# Patient Record
Sex: Female | Born: 1940 | Race: White | Hispanic: No | Marital: Married | Smoking: Former smoker
Health system: Southern US, Community
[De-identification: ages and names within clinical notes are randomized; demographics above are authoritative.]

## PROBLEM LIST (undated history)

## (undated) DIAGNOSIS — I10 Essential (primary) hypertension: Secondary | ICD-10-CM

## (undated) HISTORY — DX: Essential (primary) hypertension: I10

## (undated) HISTORY — PX: HERNIA REPAIR: SHX51

---

## 2004-06-21 ENCOUNTER — Emergency Department: Payer: Self-pay | Admitting: Emergency Medicine

## 2004-07-15 ENCOUNTER — Encounter: Payer: Self-pay | Admitting: Physician Assistant

## 2004-07-18 ENCOUNTER — Encounter: Payer: Self-pay | Admitting: Physician Assistant

## 2004-08-18 ENCOUNTER — Encounter: Payer: Self-pay | Admitting: Physician Assistant

## 2004-09-18 ENCOUNTER — Encounter: Payer: Self-pay | Admitting: Physician Assistant

## 2008-07-10 ENCOUNTER — Emergency Department: Payer: Self-pay | Admitting: Emergency Medicine

## 2011-12-02 ENCOUNTER — Inpatient Hospital Stay: Payer: Self-pay | Admitting: Surgery

## 2011-12-02 LAB — CBC
HGB: 13.3 g/dL (ref 12.0–16.0)
MCH: 28.4 pg (ref 26.0–34.0)
MCHC: 32.4 g/dL (ref 32.0–36.0)
MCV: 88 fL (ref 80–100)
RBC: 4.69 10*6/uL (ref 3.80–5.20)
RDW: 12.6 % (ref 11.5–14.5)

## 2011-12-02 LAB — COMPREHENSIVE METABOLIC PANEL
Alkaline Phosphatase: 69 U/L (ref 50–136)
BUN: 34 mg/dL — ABNORMAL HIGH (ref 7–18)
Bilirubin,Total: 2.1 mg/dL — ABNORMAL HIGH (ref 0.2–1.0)
Chloride: 103 mmol/L (ref 98–107)
Creatinine: 1.16 mg/dL (ref 0.60–1.30)
EGFR (Non-African Amer.): 48 — ABNORMAL LOW
Osmolality: 284 (ref 275–301)
Potassium: 3.6 mmol/L (ref 3.5–5.1)

## 2011-12-02 LAB — URINALYSIS, COMPLETE
Bilirubin,UR: NEGATIVE
Glucose,UR: NEGATIVE mg/dL (ref 0–75)
Ph: 5 (ref 4.5–8.0)
Specific Gravity: 1.016 (ref 1.003–1.030)
Transitional Epi: 18

## 2011-12-03 LAB — CBC WITH DIFFERENTIAL/PLATELET
Basophil #: 0 10*3/uL (ref 0.0–0.1)
Basophil %: 0.2 %
Eosinophil #: 0 10*3/uL (ref 0.0–0.7)
Eosinophil %: 0.1 %
HCT: 36.1 % (ref 35.0–47.0)
HGB: 11.7 g/dL — ABNORMAL LOW (ref 12.0–16.0)
Lymphocyte #: 1 10*3/uL (ref 1.0–3.6)
Lymphocyte %: 6.8 %
MCHC: 32.3 g/dL (ref 32.0–36.0)
MCV: 88 fL (ref 80–100)
Monocyte #: 1 x10 3/mm — ABNORMAL HIGH (ref 0.2–0.9)
Monocyte %: 7.1 %
Neutrophil #: 12.3 10*3/uL — ABNORMAL HIGH (ref 1.4–6.5)
Neutrophil %: 85.8 %
RBC: 4.09 10*6/uL (ref 3.80–5.20)
WBC: 14.3 10*3/uL — ABNORMAL HIGH (ref 3.6–11.0)

## 2011-12-03 LAB — BASIC METABOLIC PANEL
Calcium, Total: 8.2 mg/dL — ABNORMAL LOW (ref 8.5–10.1)
Chloride: 101 mmol/L (ref 98–107)
Co2: 25 mmol/L (ref 21–32)
Creatinine: 1.33 mg/dL — ABNORMAL HIGH (ref 0.60–1.30)
EGFR (Non-African Amer.): 41 — ABNORMAL LOW
Glucose: 141 mg/dL — ABNORMAL HIGH (ref 65–99)

## 2011-12-04 LAB — CBC WITH DIFFERENTIAL/PLATELET
Basophil #: 0 10*3/uL (ref 0.0–0.1)
Basophil %: 0.1 %
Eosinophil #: 0.1 10*3/uL (ref 0.0–0.7)
HCT: 32.8 % — ABNORMAL LOW (ref 35.0–47.0)
Lymphocyte #: 1.7 10*3/uL (ref 1.0–3.6)
Lymphocyte %: 14.6 %
MCH: 28.4 pg (ref 26.0–34.0)
MCHC: 31.9 g/dL — ABNORMAL LOW (ref 32.0–36.0)
Monocyte #: 0.9 x10 3/mm (ref 0.2–0.9)
Monocyte %: 7.7 %
Neutrophil #: 8.9 10*3/uL — ABNORMAL HIGH (ref 1.4–6.5)
RDW: 12.7 % (ref 11.5–14.5)
WBC: 11.5 10*3/uL — ABNORMAL HIGH (ref 3.6–11.0)

## 2011-12-04 LAB — BASIC METABOLIC PANEL
Anion Gap: 8 (ref 7–16)
BUN: 26 mg/dL — ABNORMAL HIGH (ref 7–18)
Chloride: 108 mmol/L — ABNORMAL HIGH (ref 98–107)
Co2: 25 mmol/L (ref 21–32)
Creatinine: 0.89 mg/dL (ref 0.60–1.30)
EGFR (Non-African Amer.): >60
Glucose: 123 mg/dL — ABNORMAL HIGH (ref 65–99)
Osmolality: 287 (ref 275–301)
Potassium: 4.1 mmol/L (ref 3.5–5.1)

## 2011-12-08 LAB — PATHOLOGY REPORT

## 2012-01-14 ENCOUNTER — Ambulatory Visit: Payer: Self-pay | Admitting: Urology

## 2012-01-23 ENCOUNTER — Ambulatory Visit: Payer: Self-pay | Admitting: Urology

## 2012-02-26 ENCOUNTER — Ambulatory Visit: Payer: Self-pay | Admitting: Urology

## 2012-07-01 IMAGING — CT CT ABD-PELV W/ CM
1 of 3 series · 13 of 32 positions shown, 18 images · non-contrast
Comparison: none

REASON FOR EXAM: (1) abd pain - increased distension - eval for verntral
hernia; (2) abd pain - i
COMMENTS:

[Series 2: soft tissue · axial · 0.91mm/px · z∈[-824,-412]mm · 13 of 157 slices shown, 18 images]
[im 10/157  soft-tissue]
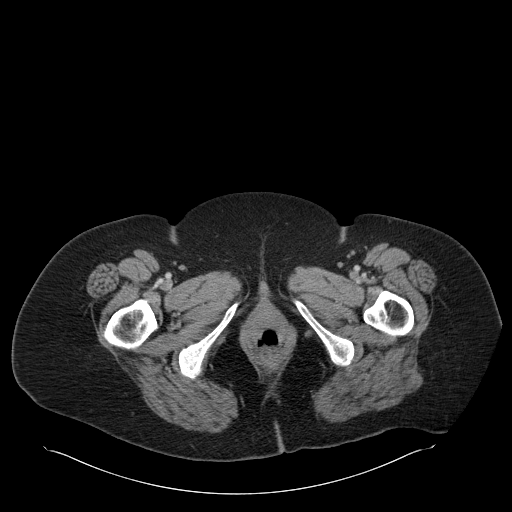
[im 10/157  bone]
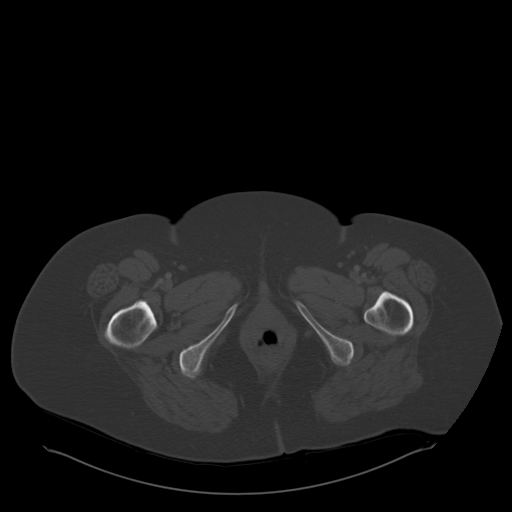
[im 28/157  soft-tissue]
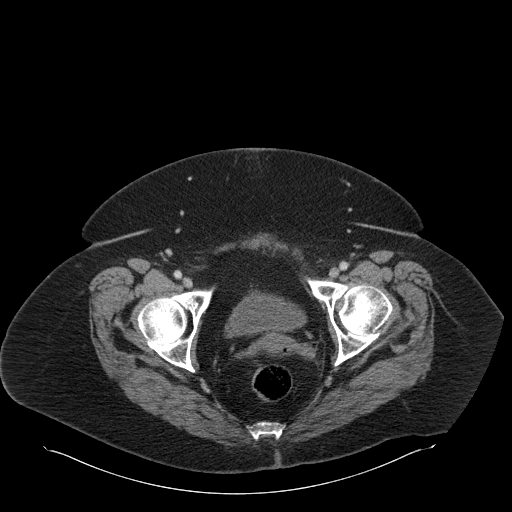
[im 37/157  soft-tissue]
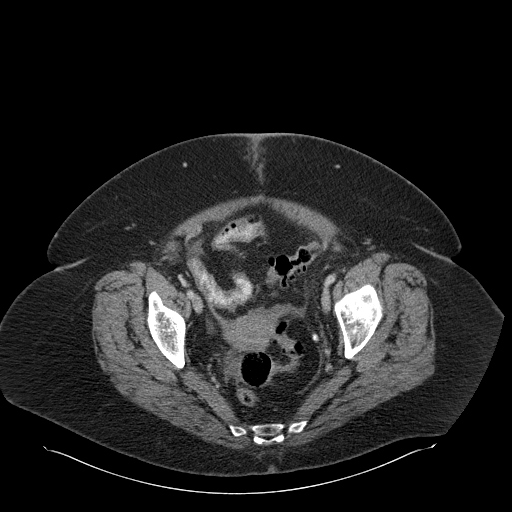
[im 46/157  soft-tissue]
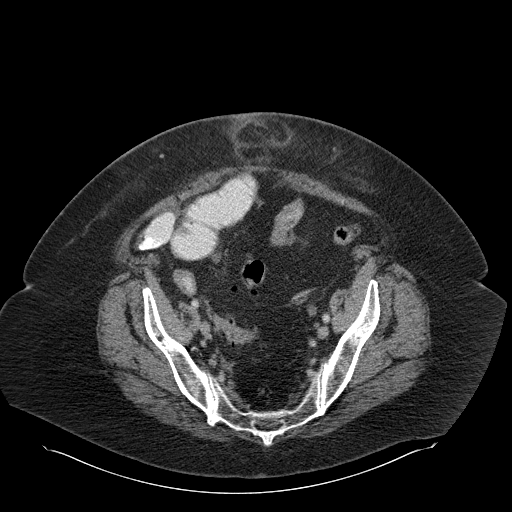
[im 65/157  soft-tissue]
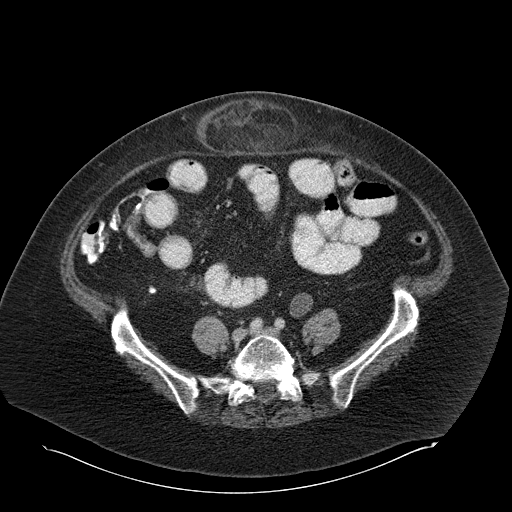
[im 74/157  soft-tissue]
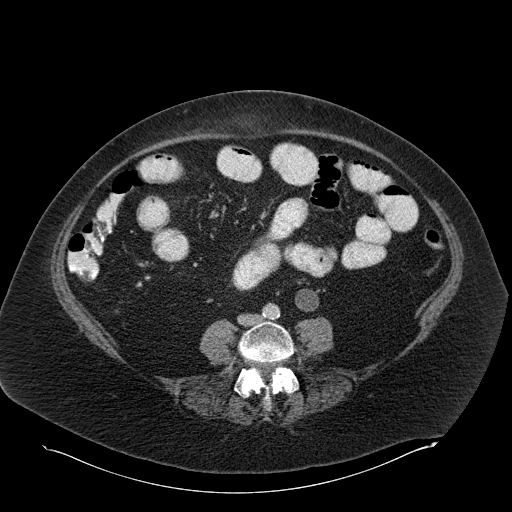
[im 83/157  soft-tissue]
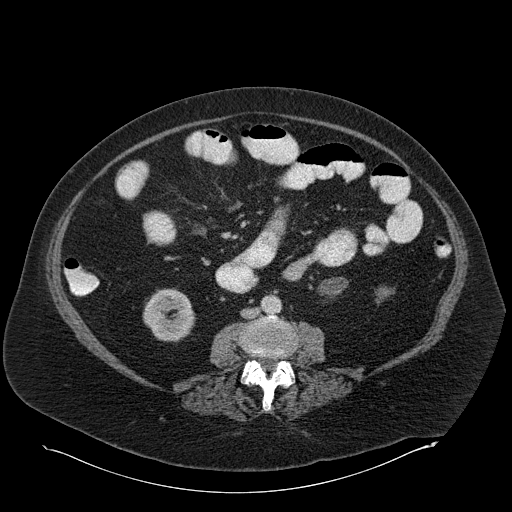
[im 101/157  soft-tissue]
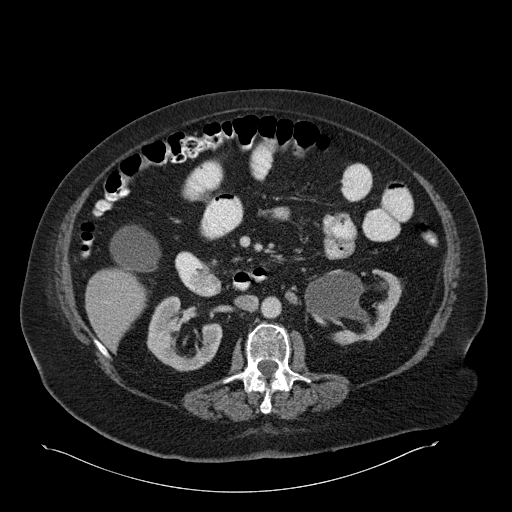
[im 111/157  soft-tissue]
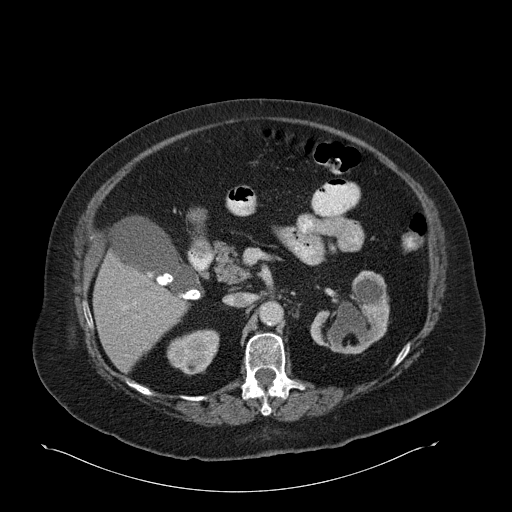
[im 111/157  bone]
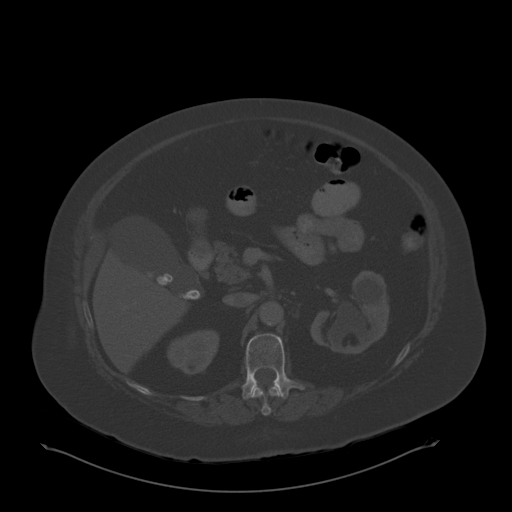
[im 120/157  soft-tissue]
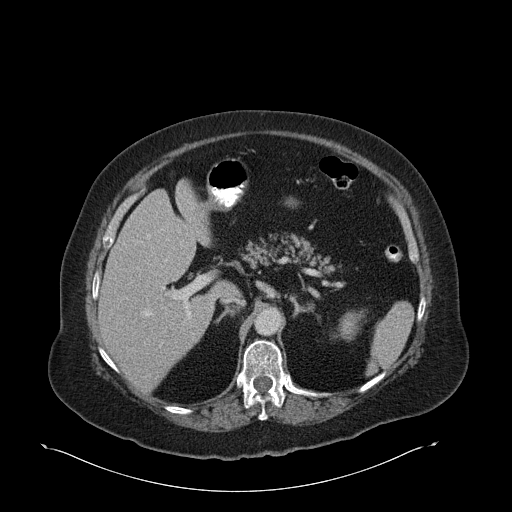
[im 120/157  lung]
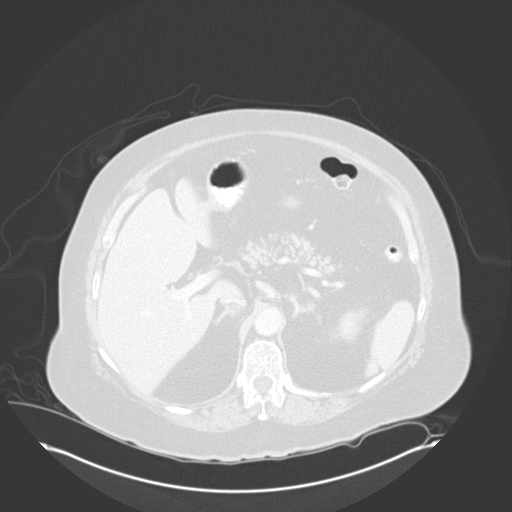
[im 129/157  lung]
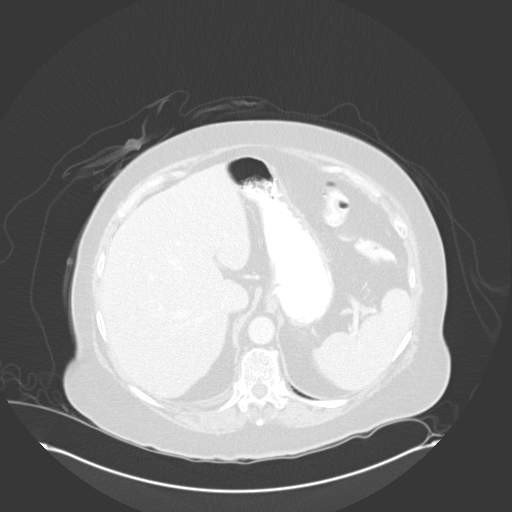
[im 138/157  soft-tissue]
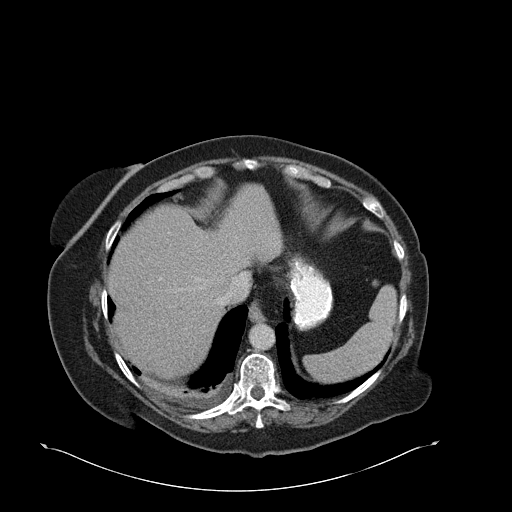
[im 138/157  lung]
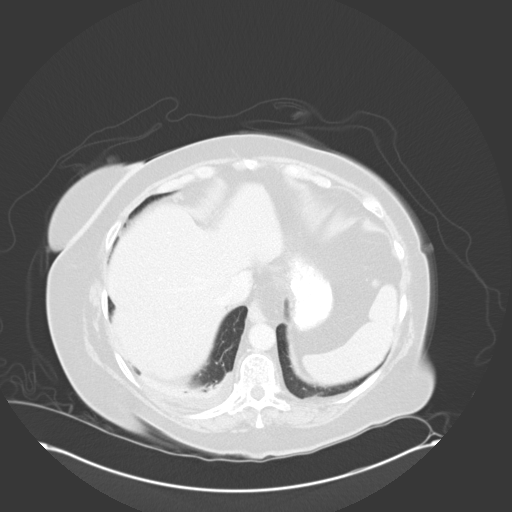
[im 147/157  soft-tissue]
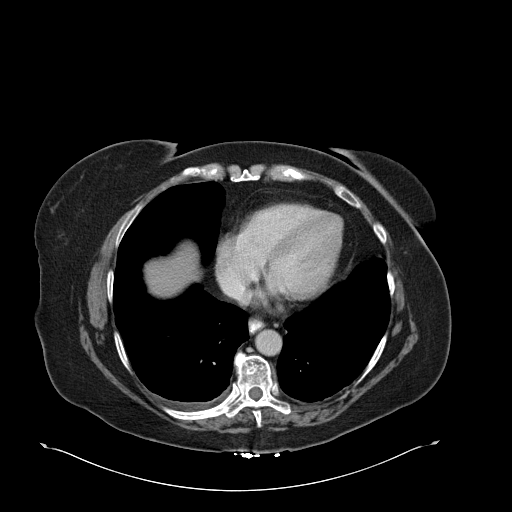
[im 147/157  lung]
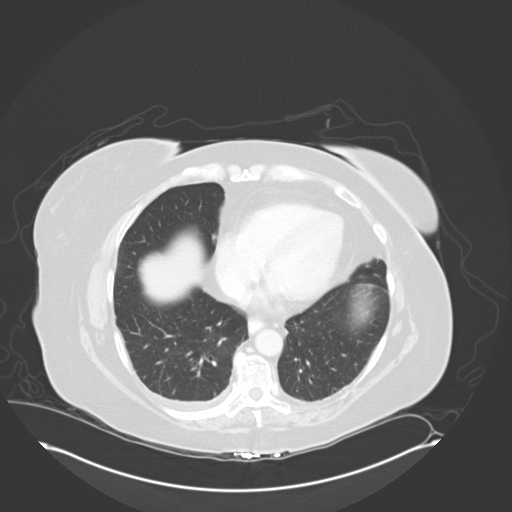

[13 of 32 positions shown; findings below may reference images not displayed]

PROCEDURE:     CT  - CT ABDOMEN / PELVIS  W  - December 02, 2011  [DATE]

RESULT:     CT of the abdomen and pelvis with 80 mL of 3sovue-4JJ iodinated
intravenous contrast and oral contrast is performed. There is no previous
exam for comparison. Images are reconstructed at 3.0 mm slice thickness in
the axial plane.

There is moderate to severe left hydronephrosis and pronounced left
hydroureter extending into the pelvis where there are 2 stones in the 10 to
11 mm size range in the distal ureter proximal to the left ureterovesical
junction. The ureter distal to this is decompressed. These stones are
visualized between images 115 and 121. The urinary bladder is decompressed.
The uterus is unremarkable. No nephrolithiasis is evident. There is a
supraumbilical ventral hernia containing a loop of small bowel with
thickened wall and surrounding presumed inflammatory stranding. There is no
pneumatosis or perforation but correlation for incarceration would be
recommended. Surgical consultation is suggested. Cholelithiasis is
demonstrated. The appendix appears normal. There is moderate small bowel
distention proximal to the hernia. The limb of the small bowel distal to
this is nondistended. This suggests partial attraction from the herniated
loop of small bowel. No significant ascites is present. There is a small
amount of free fluid in the pelvis. Some scattered colonic diverticulosis is
present. The liver shows evidence of fatty infiltration. The spleen is
unremarkable. The adrenal glands are normal. The aorta is normal.
IMPRESSION: 1. Partial small bowel obstruction secondary to a ventral hernia in the
umbilical region just superior to the level of the umbilicus. The loop of
small bowel shows a thickened wall with some proximal obstructive changes.
Inflammatory stranding is seen in the adjacent fat. No perforation or
abscess is evident.
2. Obstructing distal left ureteral calculi better large measuring up to 10
to 11 mm in diameter. Moderate to severe left proximal hydroureter and
hydronephrosis is evident. Urologic consultation is recommended with
consideration for stone extraction or lithotripsy.
3. Cholelithiasis. No definite evidence of acute cholecystitis.
Findings were discussed with the requesting physician at the time of
dictation.(*)

## 2012-08-22 IMAGING — CR DG ABDOMEN 1V
1 series · 2 of 2 positions shown · non-contrast
Comparison: none

REASON FOR EXAM: Nephrolithiasis and Renal Colic
COMMENTS:

PROCEDURE:     DXR - DXR KIDNEY URETER BLADDER  - January 23, 2012 [DATE]
RESULT:     Comparisons:  None

[Series 1: ap · 0.17mm/px · 2 of 2 slices shown]
[im 1/2]
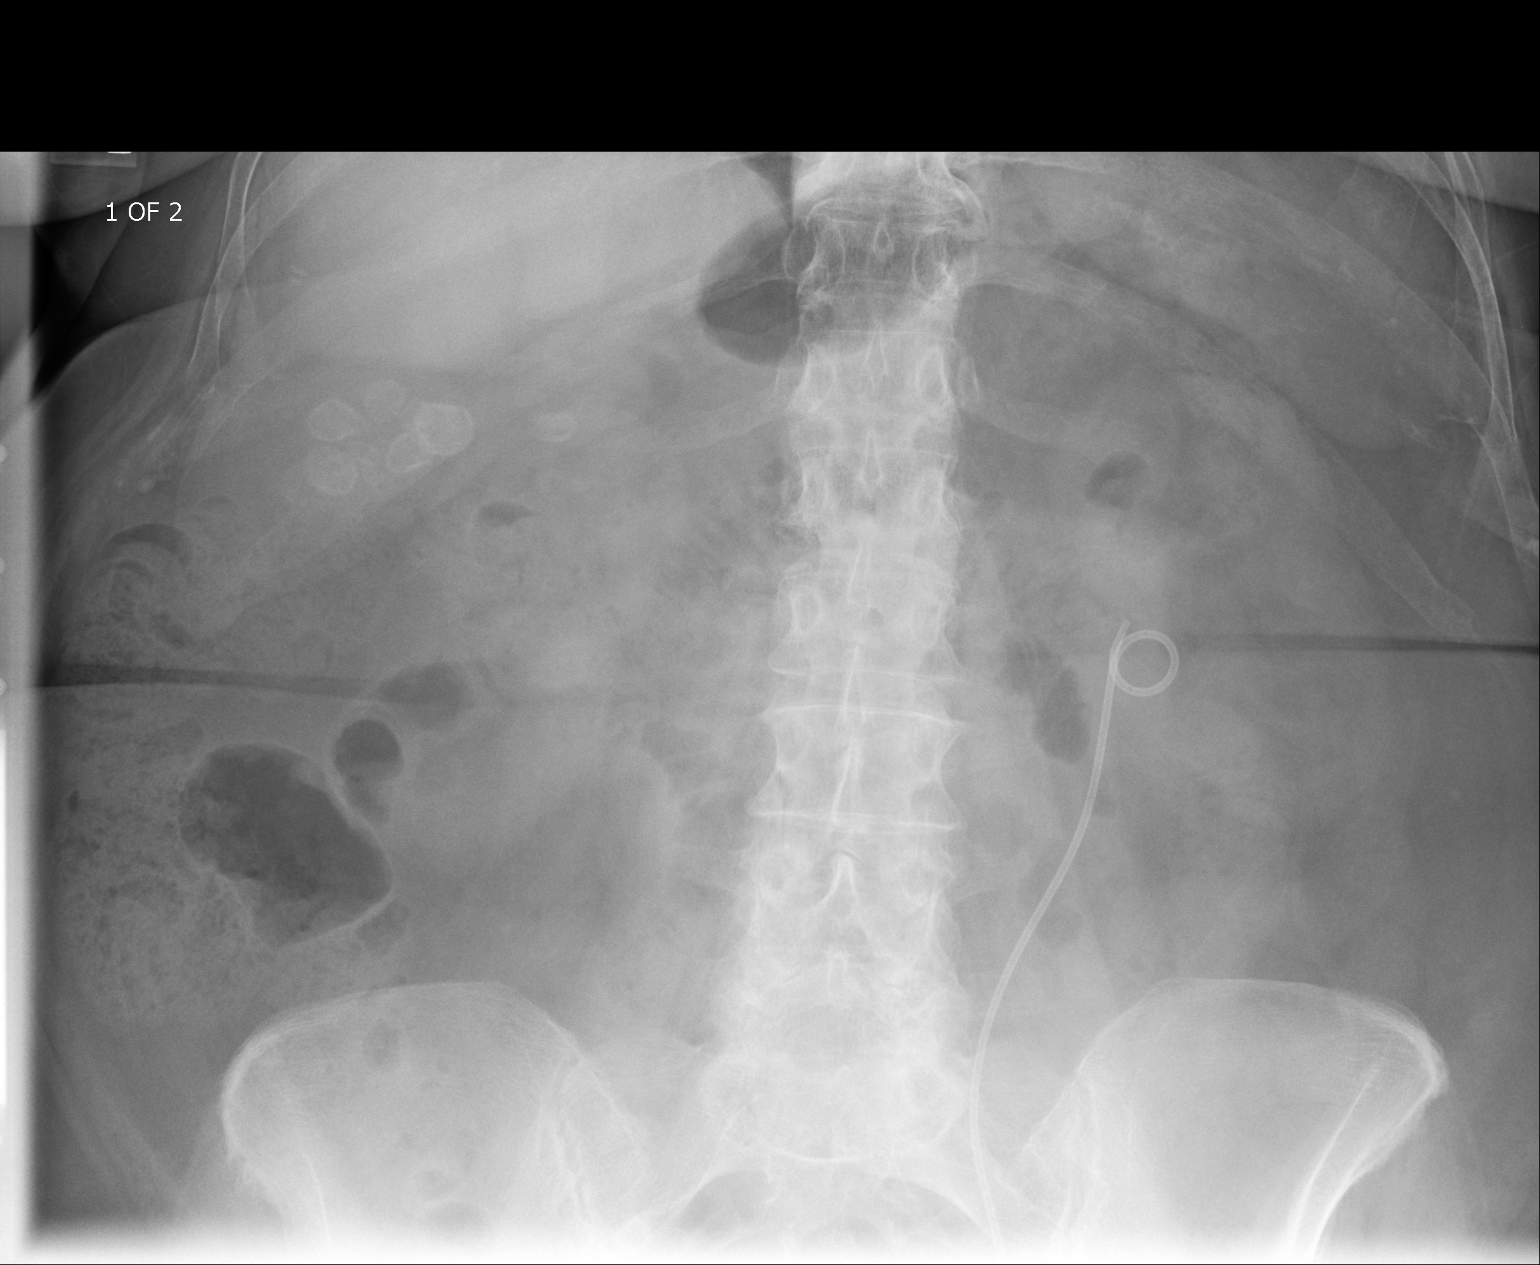
[im 2/2]
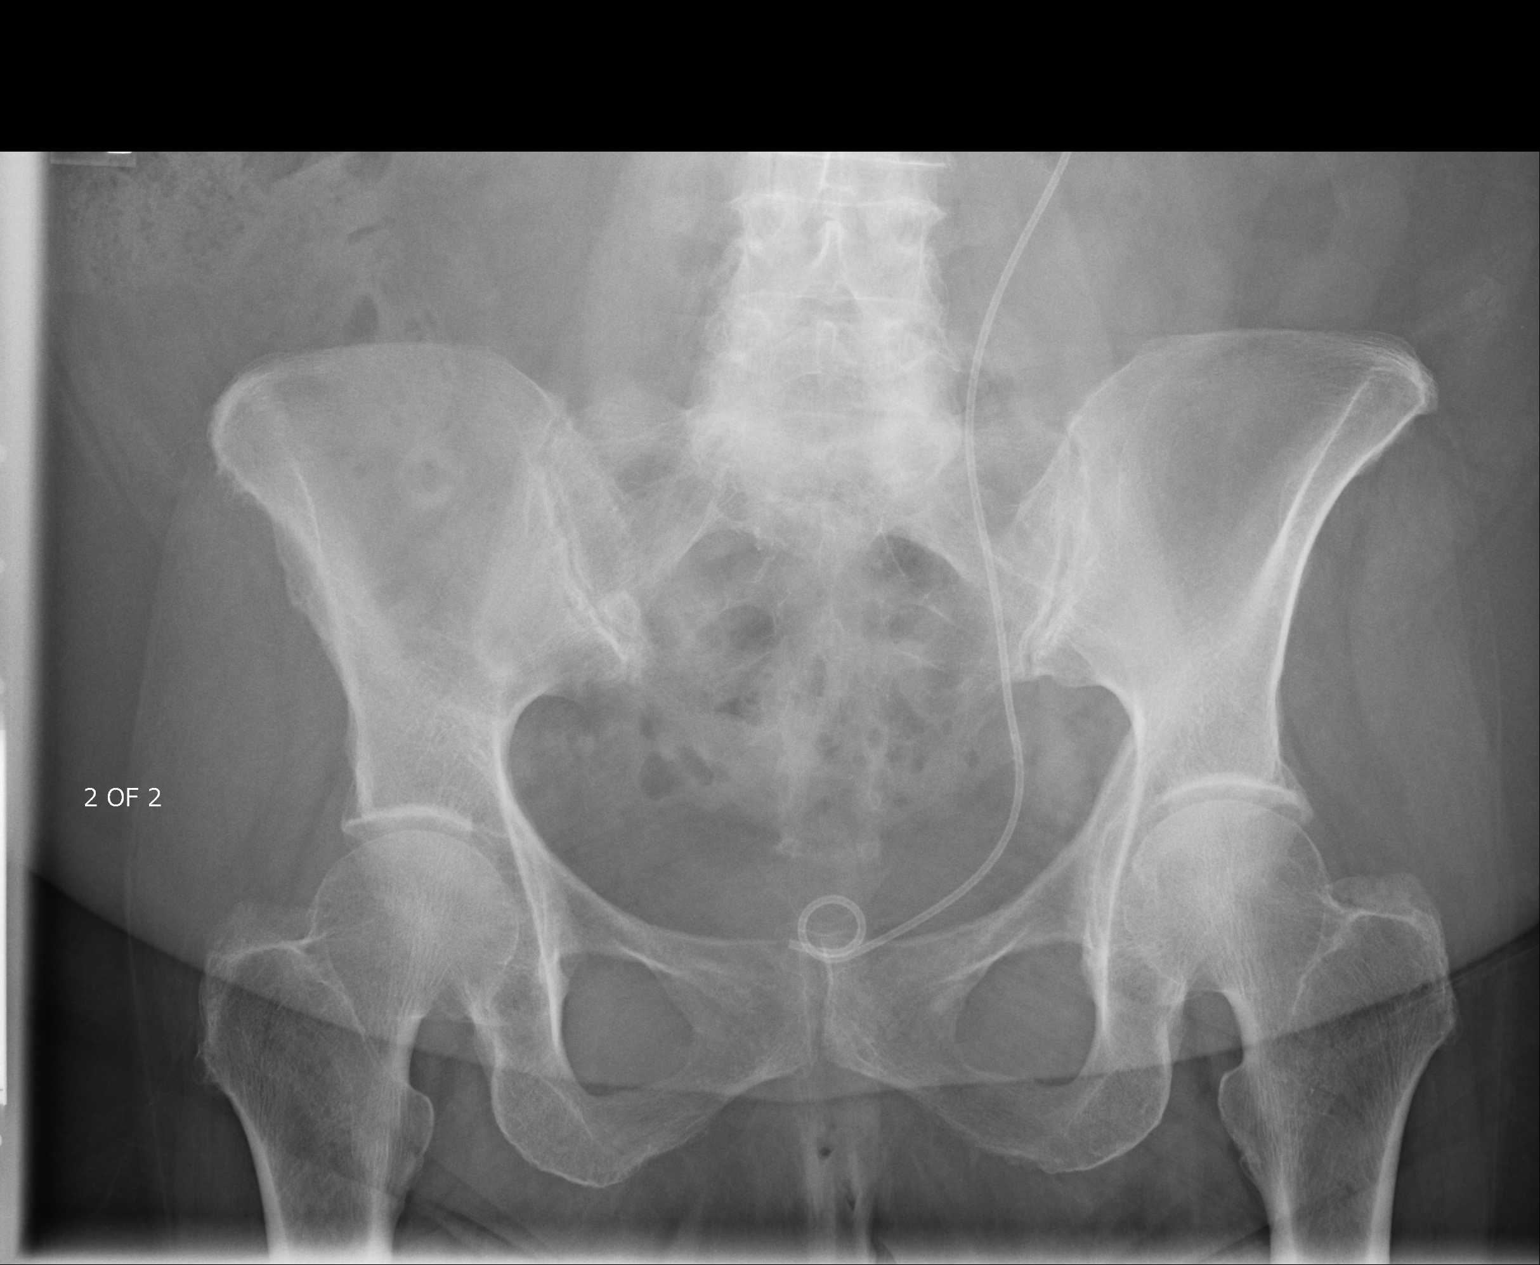

[2 of 2 positions shown; findings below may reference images not displayed]

FINDINGS: Supine radiograph of the abdomen is provided.

There is a nonspecific bowel gas pattern. There is no bowel dilatation to
suggest obstruction. There are cholelithiasis. There is a left
nephroureteral stent present. There is no pathologic calcification along the
expected course of the ureters. There is no evidence of pneumoperitoneum,
portal venous gas, or pneumatosis.

The osseous structures are unremarkable.
IMPRESSION: Left nephroureteral stent in satisfactory position.

[REDACTED]

## 2014-01-10 DIAGNOSIS — J309 Allergic rhinitis, unspecified: Secondary | ICD-10-CM | POA: Insufficient documentation

## 2014-01-10 DIAGNOSIS — I10 Essential (primary) hypertension: Secondary | ICD-10-CM | POA: Insufficient documentation

## 2014-01-10 DIAGNOSIS — F419 Anxiety disorder, unspecified: Secondary | ICD-10-CM | POA: Insufficient documentation

## 2014-05-26 ENCOUNTER — Ambulatory Visit (INDEPENDENT_AMBULATORY_CARE_PROVIDER_SITE_OTHER): Payer: 59

## 2014-05-26 ENCOUNTER — Ambulatory Visit (INDEPENDENT_AMBULATORY_CARE_PROVIDER_SITE_OTHER): Payer: 59 | Admitting: Podiatry

## 2014-05-26 ENCOUNTER — Encounter: Payer: Self-pay | Admitting: Podiatry

## 2014-05-26 VITALS — BP 122/63 | HR 60 | Resp 16 | Ht 64.0 in | Wt 212.0 lb

## 2014-05-26 DIAGNOSIS — M722 Plantar fascial fibromatosis: Secondary | ICD-10-CM

## 2014-05-26 DIAGNOSIS — M7661 Achilles tendinitis, right leg: Secondary | ICD-10-CM

## 2014-05-26 MED ORDER — TRIAMCINOLONE ACETONIDE 10 MG/ML IJ SUSP
10.0000 mg | Freq: Once | INTRAMUSCULAR | Status: AC
  Administered 2014-05-26: 10 mg

## 2014-05-26 NOTE — Progress Notes (Signed)
   Subjective:    Patient ID: Courtney Mccoy, female    DOB: 1941-06-10, 73 y.o.   MRN: [id number]  HPI Comments: The right heel has been aching. It has been going on for over 1 year. It is the back of the heel and around the sides of the heel. Been using etodolac with no relief  Foot Pain      Review of Systems  Musculoskeletal: Positive for back pain.  All other systems reviewed and are negative.      Objective:   Physical Exam        Assessment & Plan:

## 2014-05-26 NOTE — Progress Notes (Signed)
Subjective:     Patient ID: Courtney Mccoy, female   DOB: 1941/07/23, 73 y.o.   MRN: [id number]  Foot Pain   patient states stating my right heel has really been bothering me for about a year and that been trying oral anti-inflammatories and stretching without relief   Review of Systems  All other systems reviewed and are negative.      Objective:   Physical Exam  Nursing note and vitals reviewed. Constitutional: She is oriented to person, place, and time.  Cardiovascular: Intact distal pulses.   Musculoskeletal: Normal range of motion.  Neurological: She is oriented to person, place, and time.  Skin: Skin is warm.   neurovascular status intact with muscle strength adequate and range of motion of the subtalar and midtarsal joint within normal limits. Patient is noted to have good digital perfusion and a well-developed arch upon weightbearing and I noted significant discomfort in the posterior right heel at the Achilles insertion on the medial side. The Achilles tendon itself is functioning well     Assessment:     Acute Achilles tendinitis right with inflammation and pain medial side    Plan:     H&P and condition discussed along with x-rays. Discussed injection and immobilization treatment explaining the chances for rupture associated with injections. Patient is willing to accept this risk and today I injected with 3 mg of dexamethasone Kenalog and 5 mg Xylocaine and instructed on reduced activity only put it on the medial side not the central or lateral side of the tendon and then applied air fracture walker. Gave instructions on continuing to wear the boot for the next several weeks and reappoint in 4 weeks

## 2014-05-26 NOTE — Patient Instructions (Signed)

## 2014-06-23 ENCOUNTER — Encounter: Payer: Self-pay | Admitting: Podiatry

## 2014-06-23 ENCOUNTER — Ambulatory Visit (INDEPENDENT_AMBULATORY_CARE_PROVIDER_SITE_OTHER): Payer: 59 | Admitting: Podiatry

## 2014-06-23 DIAGNOSIS — M7661 Achilles tendinitis, right leg: Secondary | ICD-10-CM

## 2014-06-25 NOTE — Progress Notes (Signed)
Subjective:     Patient ID: Courtney BinetMarsha Mccoy, female   DOB: 1941/07/25, 73 y.o.   MRN: [id number]  HPIpatient states has mild discomfort   Review of Systems     Objective:   Physical Exam Patient states my foot has improved quite a bit and the pain is getting better with immobilization and the injection    Assessment:     Improving Achilles tendinitis right with inflammation still present but to a much lighter degree upon palpation    Plan:     Reviewed condition and advised on physical therapy ice therapy and occasional boot usage. Patient is discharged and less symptoms were to intensify or return

## 2014-12-10 NOTE — Consult Note (Signed)
PATIENT NAME:  Courtney Mccoy, Courtney Mccoy MR#:  [id number] DATE OF BIRTH:  13-Dec-1940  DATE OF CONSULTATION:  12/02/2011  REFERRING PHYSICIAN:  Janalyn Harderavid Kaminski, MD CONSULTING PHYSICIAN:  Scott C. Stoioff, MD  REASON FOR CONSULTATION: Left hydronephrosis and ureteral stones.   HISTORY OF PRESENT ILLNESS: This is a 74 year old female who was seen by her primary care physician earlier today for moderate to severe mid abdominal pain. She was subsequently sent to the emergency department for evaluation of a possible incarcerated ventral hernia. CT scan was performed which did show a ventral hernia containing a loop of small bowel. She was incidentally found to have moderate to severe left hydronephrosis and hydroureter with two large distal ureteral stones measuring approximately 10 mm. She does have a prior history of stone disease and underwent an open ureteral lithotomy by Dr. Leonette MonarchHarman greater than 20 years ago. She has had no recent problems with stones and denies flank pain, frequency, urgency, or dysuria. She has had no fever or chills.   PAST MEDICAL HISTORY: Hypertension.   PAST SURGICAL HISTORY: Ureterolithotomy.  MEDICATIONS: Lisinopril 20 mg daily.   ALLERGIES: No known drug allergies.   SOCIAL HISTORY: Prior history of tobacco use, none recently.   REVIEW OF SYSTEMS: GENERAL: Denies fever/chills. GASTROINTESTINAL: Positive abdominal pain. GENITOURINARY: as per the history of present illness. CARDIOVASCULAR: Positive hypertension. PULMONARY: Denies cough or shortness of breath.  HEME: No history of bleeding or clotting disorders. The remainder of the review of systems is otherwise noncontributory.   PHYSICAL EXAMINATION:   VITAL SIGNS: Temperature 97.9, pulse 124, and blood pressure 118/67.   GENERAL: The patient was in moderate distress secondary to mid abdominal pain.   ABDOMEN: Protuberant with moderate supraumbilical tenderness.  BACK: No CVA tenderness.   DATA: Urinalysis: Significant  pyuria. WBCs 17.8. Creatinine 1.16.  CT of the abdomen and pelvis was reviewed. There is moderate to severe left hydronephrosis and hydroureter to the distal ureter where two 10 mm stones are seen. There is some loss of renal parenchyma on the left and these findings are most likely chronic.   IMPRESSION: Left ureteral calculi with moderate to severe hydronephrosis - she has no flank pain or fever. Based on her CT scan this does not appear to be acute, however, it does need to be addressed. Her abdominal pain appears more related to her ventral hernia and not the stone. She is afebrile and I feel the stones can be treated as an outpatient.   RECOMMENDATIONS:  1. If she does require surgery for her ventral hernia, I would recommend placement of a left ureteral stent under the same anesthetic. If inpatient management is not deemed necessary, we will schedule stent placement/possible ureteroscopy as an outpatient.  2. We will follow.  ____________________________ Verna CzechScott C. Lonna CobbStoioff, MD scs:slb D: 12/02/2011 18:05:50 ET T: 12/03/2011 09:36:21 ET JOB#: [id number]  cc: Lorin PicketScott C. Lonna CobbStoioff, MD, <Dictator> Riki AltesSCOTT C STOIOFF MD ELECTRONICALLY SIGNED 12/11/2011 18:48

## 2014-12-10 NOTE — Consult Note (Signed)
She is to have her incarcerated ventral hernia repair this morning.  I spoke with Dr. Michela PitcherEly yesterday evening and he may need to use mesh in the repair.  She does have significant pyuria and there is an increased incidence of bacteremia with stent placement.  Since she is asymptomatic and the ureteral calculi do not appear to be acute will not plan on placing stent at the time of her repair.  Electronic Signatures: Riki AltesStoioff, Jasime Westergren C (MD)  (Signed on 17-Apr-13 08:07)  Authored  Last Updated: 17-Apr-13 08:07 by Riki AltesStoioff, Chessica Audia C (MD)

## 2014-12-10 NOTE — Consult Note (Signed)
urine cx growing > 100,000 gnr.antibioticschedule outpt f/u for stone management  Electronic Signatures: Riki AltesStoioff, Scott C (MD)  (Signed on 17-Apr-13 15:47)  Authored  Last Updated: 17-Apr-13 15:47 by Riki AltesStoioff, Scott C (MD)

## 2014-12-10 NOTE — Discharge Summary (Signed)
PATIENT NAME:  Courtney Mccoy, Courtney Mccoy MR#:  [id number] DATE OF BIRTH:  08/30/1940  DATE OF ADMISSION:  12/02/2011 DATE OF DISCHARGE:  12/06/2011  BRIEF HISTORY: Courtney Mccoy is a 74 year old woman seen in the Emergency Room with an incarcerated ventral hernia. She has had a longstanding ventral hernia just above her umbilicus, but she noted the sudden onset of pain approximately 48 to 72 hours prior to admission to the hospital. She did not vomit, but has had crampy abdominal pain with anorexia. She presented to the Emergency Room for further evaluation after seeing her primary care doctor. Work-up in the Emergency Room revealed an elevated white blood cell count of 17,000 with presence of an incarcerated hernia. There was surrounding inflammatory changes in the omentum in the hernia sac. The bowel did not appear obstructed as there was proximal and distal contrast. There was also contrast all the way to the colon. There was no evidence of any pneumatosis. She was admitted to the hospital, placed on IV antibiotics and pain medication. She was taken to surgery on the following day where she underwent an incarcerated abdominal hernia repair. The procedure was uncomplicated. The procedure was repaired with Composix mesh. She did well postoperatively with return of bowel function very quickly. She was able to tolerate a regular diet by 04/19 and discharged home on 04/20.   DISCHARGE MEDICATIONS:  1. Lisinopril 20 mg p.o. daily.  2. Norco 1 to 2 mg p.o. q.6 hours p.r.n.   FINAL DISCHARGE DIAGNOSES:  1. Hypertension. 2. Incarcerated ventral hernia.  SURGERY: Incarcerated ventral hernia repair.   ____________________________ Carmie Endalph L. Ely III, MD rle:ap D: 12/12/2011 22:47:51 ET          T: 12/13/2011 13:19:45 ET              JOB#: [id number] cc: Carmie Endalph L. Ely III, MD, <Dictator> Marina Goodellale E. Feldpausch, MD Quentin OreALPH L ELY MD ELECTRONICALLY SIGNED 12/15/2011 21:46

## 2014-12-10 NOTE — Op Note (Signed)
PATIENT NAMOren Binet:  Courtney Mccoy, Courtney Mccoy MR#:  [id number] DATE OF BIRTH:  05/12/1941  DATE OF PROCEDURE:  12/03/2011  PREOPERATIVE DIAGNOSIS: Incarcerated ventral hernia.   POSTOPERATIVE DIAGNOSIS: Incarcerated ventral hernia.    OPERATION: Repair of incarcerated ventral hernia.   ANESTHESIA: General.  SURGEON: Quentin Orealph L. Ely, III, MD    OPERATIVE PROCEDURE: With the patient in the supine position after induction of appropriate general anesthesia, the patient's abdomen was prepped with Betadine and draped with sterile towels. An alcohol wipe and Betadine impregnated Steri-Drape were utilized. A midline incision was made from just above the umbilicus to just below the umbilicus and carried down through the subcutaneous tissue with Bovie electrocautery. The hernia sac was immediately encountered and dissected back to its base on the fascia wall. Sac could not be reduced. It was opened and there was a large amount of ischemic appearing omentum in the sac which was adherent to the sac wall. The omentum was dissected free and removed. There was a single loop of small bowel which was mildly edematous but did not have any evidence of ischemic change and did not demonstrate any necrosis. It was dropped back into the abdomen. The defect had to be slightly enlarged to accommodate reduction of all the material. Final defect was approximately 4 cm. An 8 cm piece of composite mesh was brought to the table and inserted into the peritoneal cavity. The Gore-Tex side was placed down. The mesh was sutured in place using 0 Surgilon. The tags were cut. The fascia was then closed over the mesh using interrupted sutures of 0 Maxon. A 10 mm flat Jackson-Pratt drain was inserted through a separate stab wound into the dead space. The umbilical skin was reapproximated with 0 Maxon. Skin was clipped. Sterile dressings applied. The patient was returned to the recovery room having tolerated the procedure well. Sponge, instrument, and needle  counts were correct x2 in the operating room.   ____________________________ Carmie Endalph L. Ely III, MD rle:drc D: 12/03/2011 08:43:01 ET T: 12/03/2011 10:42:58 ET JOB#: [id number] cc: Quentin Orealph L. Ely III, MD, <Dictator>, Scott C. Lonna CobbStoioff, MD, Marina Goodellale E. Feldpausch, MD Quentin OreALPH L ELY MD ELECTRONICALLY SIGNED 12/03/2011 17:27

## 2014-12-10 NOTE — H&P (Signed)
PATIENT NAMOren Mccoy:  Mccoy, Courtney MR#:  [id number] DATE OF BIRTH:  July 01, 1941  DATE OF ADMISSION:  12/02/2011  PRIMARY CARE PHYSICIAN: Nonlocal physician ADMITTING PHYSICIAN: Dr. Michela PitcherEly  CHIEF COMPLAINT: Abdominal pain.   BRIEF HISTORY: Courtney BinetMarsha Sweeney is a 74 year old woman seen in the Emergency Room with a several day history of abdominal pain. She noted the gradual development of an umbilical hernia over the last couple of months but with lifting a heavy load over the weekend and noticed a sudden onset of nausea and dry heaves. She did not vomit. The pain was crampy in her umbilicus and persisted over the next 72 hours. She has been unable to eat normally and she does not feel hungry but she has not vomited and has not had any further dry heaves. She denies any fever. She has been passing gas and has had a couple of bowel movements. She went to see her primary care doctor today who thought she probably had an incarcerated hernia and referred her to the Emergency Room for further evaluation.   Work-up in the Emergency Room revealed a slightly elevated white blood cell count of 17,000. Contrasted CT scan did confirm the presence of what appears to be an incarcerated hernia with surrounding inflammatory omentum changes in the hernia sac. The bowel was not obstructed though as there is contrast proximally and distally and there is contrast in the colon all the way to the distal transverse colon. There did not appear to be any evidence of pneumatosis and some mild bowel wall thickening. She does also have severely dilated left ureter with an obstructing stone and obvious hydronephrosis. Urinalysis demonstrated a large amount of red and white cells in the urine. The urology service has been consulted by the Emergency Room physicians.   PAST MEDICAL HISTORY: Her only previous surgery has been for nephrolithiasis. She denies history of hepatitis, yellow jaundice, pancreatitis, peptic ulcer disease, gallbladder disease,  or diverticulitis. She has no cardiac disease or diabetes. She is hypertensive currently on single dose therapy.   SOCIAL HISTORY: She is not a cigarette smoker and does not drink alcohol. She is retired and does not work outside her home.   REVIEW OF SYSTEMS: 10 point review of systems undertaken with the patient. She denies any general change in physical condition, particularly any weakness or malaise. She denies any visual changes or hearing loss. She denies any neck or cervical pain. She has no swallowing complaints. She has no shortness of breath or dyspnea on exertion. She denies any cardiac dysrhythmias, palpitations or orthopnea. GI symptoms are noted above. GU symptoms are noted above. She has no psychiatric or neurologic symptoms and she denies any extremity problems. She has no gait disturbances and no numbness or tingling in her lower extremities.   PHYSICAL EXAMINATION:  VITAL SIGNS: Blood pressure 100/70, heart rate is 96 and regular. She is afebrile.   GENERAL: She is alert and oriented.   HEENT: Exam is unremarkable. She has no scleral icterus. Her pupils were equally round. She has no facial deformity.   NECK: Supple without adenopathy. Trachea is midline and I cannot palpate her thyroid gland.   PULMONARY: Reveals no adventitious sounds and she appears to have normal excursion.   CARDIAC: No murmurs or gallops to my ear. Seems to be in normal sinus rhythm.   ABDOMEN: Distended with obvious erythematous and cellulitic changes around the umbilicus. She has a clearly defined mass in the supraumbilical area. It is tender and nonreducible. She has active  bowel sounds without evidence of any rushes. She has no rebound but does have some guarding in the hernia sac. There are no inguinal hernias noted.   EXTREMITIES: She has no lower extremity deformity, full range of motion, and good distal pulses.   PSYCHIATRIC: Normal orientation and affect.   LABORATORY, DIAGNOSTIC AND  RADIOLOGICAL DATA: I have independently reviewed the CT scan and have reviewed it with the radiologist. There clearly is bowel and preperitoneal fat or omentum in the hernia sac. The bowel enters and exits in a single slice and does not appear to be any evidence of complete obstruction. Contrast goes into the hernia and does come out although is a slight bowel mismatch. Does not appear to be any pneumatosis or bowel wall thickening to suggest ischemic change. She does have a very distended left ureter and an obviously obstructing stone and multiple gallstones noted. White blood cell count 17,000. Bilirubin is slightly elevated at 2.1.   ASSESSMENT AND PLAN: I suspect this woman's problems are primarily related to her incarcerated hernia. Do not see any evidence of strangulation or obstruction. Will admit her to the hospital, rehydrate her, put her on some antibiotics and plan surgical intervention tomorrow morning. This plan has been outlined to the patient in detail and she is in agreement. Risks, benefits and options have been outlined and accepted.   ____________________________ Carmie End, MD rle:cms D: 12/02/2011 18:24:55 ET T: 12/03/2011 06:20:36 ET JOB#: [id number]  cc: Quentin Ore III, MD, <Dictator> Scott C. Lonna Cobb, MD Marina Goodell, MD Quentin Ore MD ELECTRONICALLY SIGNED 12/03/2011 8:46

## 2014-12-10 NOTE — Consult Note (Signed)
Brief Consult Note: Diagnosis: left hydronephrosis/ left ureteral calculi/ pyuria.   Patient was seen by consultant.   Consult note dictated.   Comments: She is afebrile and has no flank pain.  There is loss of left renal parenchyma and it is unlikely these findings are acute.  Her stones/hydronephrosis can be managed as an outpatient however if admission or surgery is required for her ventral hernia would recommend placement of a left ureteral stent.  Will follow.  Electronic Signatures: Riki AltesStoioff, Freyja Govea C (MD)  (Signed 16-Apr-13 18:09)  Authored: Brief Consult Note   Last Updated: 16-Apr-13 18:09 by Riki AltesStoioff, Petrita Blunck C (MD)
